# Patient Record
Sex: Female | Born: 1995 | Race: White | Hispanic: No | Marital: Single
Health system: Southern US, Community
[De-identification: ages and names within clinical notes are randomized; demographics above are authoritative.]

---
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
## 2013-01-06 ENCOUNTER — Ambulatory Visit: Payer: Self-pay | Admitting: Otolaryngology

## 2014-05-01 ENCOUNTER — Emergency Department: Payer: Self-pay | Admitting: Emergency Medicine

## 2014-05-01 LAB — COMPREHENSIVE METABOLIC PANEL
ALK PHOS: 54 U/L
ALT: 25 U/L
ANION GAP: 13 (ref 7–16)
Albumin: 4.9 g/dL
BUN: 17 mg/dL
Bilirubin,Total: 0.8 mg/dL
CO2: 23 mmol/L
CREATININE: 0.98 mg/dL
Calcium, Total: 9.5 mg/dL
Chloride: 105 mmol/L
EGFR (African American): >60
EGFR (Non-African Amer.): >60
GLUCOSE: 123 mg/dL — AB
Potassium: 4 mmol/L
SGOT(AST): 27 U/L
Sodium: 141 mmol/L
TOTAL PROTEIN: 8.6 g/dL — AB

## 2014-05-01 LAB — URINALYSIS, COMPLETE
Bilirubin,UR: NEGATIVE
Blood: NEGATIVE
Glucose,UR: NEGATIVE mg/dL (ref 0–75)
Hyaline Cast: 3
Leukocyte Esterase: NEGATIVE
Nitrite: NEGATIVE
Ph: 5 (ref 4.5–8.0)
RBC,UR: 4 /HPF (ref 0–5)
SPECIFIC GRAVITY: 1.035 (ref 1.003–1.030)
Squamous Epithelial: 9

## 2014-05-01 LAB — CBC WITH DIFFERENTIAL/PLATELET
BASOS ABS: 0 10*3/uL (ref 0.0–0.1)
Basophil %: 0.1 %
Eosinophil #: 0 10*3/uL (ref 0.0–0.7)
Eosinophil %: 0.1 %
HCT: 43 % (ref 35.0–47.0)
HGB: 14.1 g/dL (ref 12.0–16.0)
LYMPHS ABS: 0.4 10*3/uL — AB (ref 1.0–3.6)
Lymphocyte %: 2.1 %
MCH: 27 pg (ref 26.0–34.0)
MCHC: 32.7 g/dL (ref 32.0–36.0)
MCV: 83 fL (ref 80–100)
Monocyte #: 0.8 x10 3/mm (ref 0.2–0.9)
Monocyte %: 4.2 %
NEUTROS ABS: 19 10*3/uL — AB (ref 1.4–6.5)
Neutrophil %: 93.5 %
PLATELETS: 307 10*3/uL (ref 150–440)
RBC: 5.2 10*6/uL (ref 3.80–5.20)
RDW: 13.3 % (ref 11.5–14.5)
WBC: 20.3 10*3/uL — ABNORMAL HIGH (ref 3.6–11.0)

## 2014-05-01 LAB — LIPASE, BLOOD: LIPASE: 49 U/L

## 2014-10-31 ENCOUNTER — Telehealth: Payer: Self-pay | Admitting: *Deleted

## 2014-10-31 NOTE — Telephone Encounter (Signed)
Amy Blevins called requesting an appointment with Dr. Marina Goodell. 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 Marina Goodell. Please call her [phone].

## 2016-03-23 IMAGING — CT CT ABD-PELV W/ CM
2 of 4 series · 16 of 46 positions shown, 18 images · IV contrast (omnipaque)
Comparison: None.

CLINICAL DATA: Mid lower back pain, nausea, vomiting, diarrhea and
bloody stool for 6 hours.

EXAM:
CT ABDOMEN AND PELVIS WITH CONTRAST
TECHNIQUE: Multidetector CT imaging of the abdomen and pelvis was performed
using the standard protocol following bolus administration of
intravenous contrast.
CONTRAST:  100 mL Omnipaque 300 IV

[Series 2: routine abd pel with · axial · 0.75mm/px · z∈[-978,-522]mm · 13 of 101 slices shown, 15 images]
[im 5/101  soft-tissue]
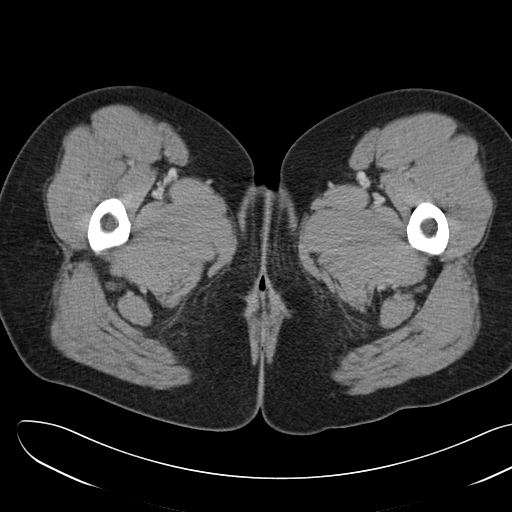
[im 5/101  bone]
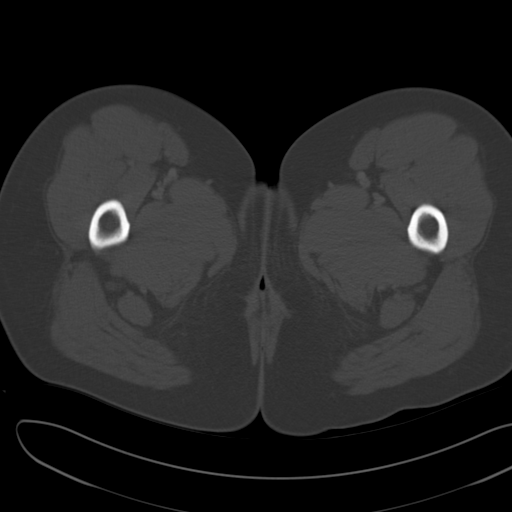
[im 13/101  soft-tissue]
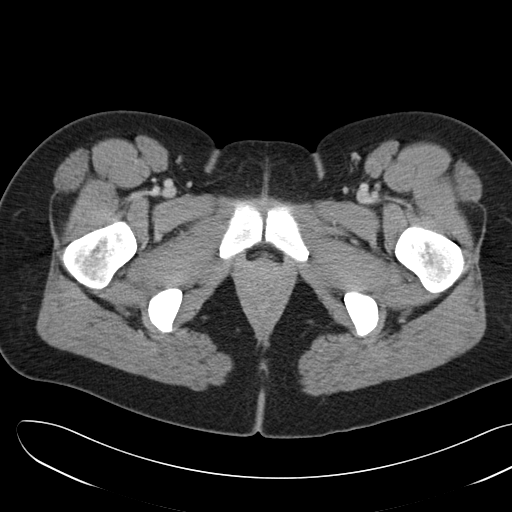
[im 21/101  soft-tissue]
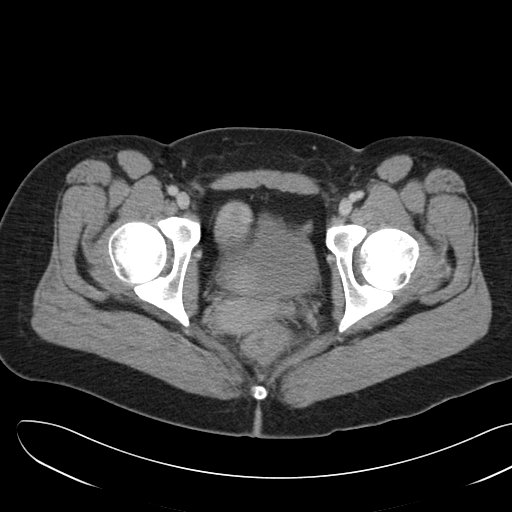
[im 30/101  soft-tissue]
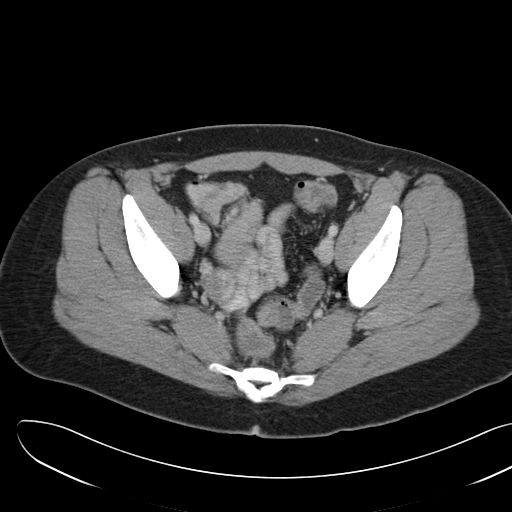
[im 34/101  soft-tissue]
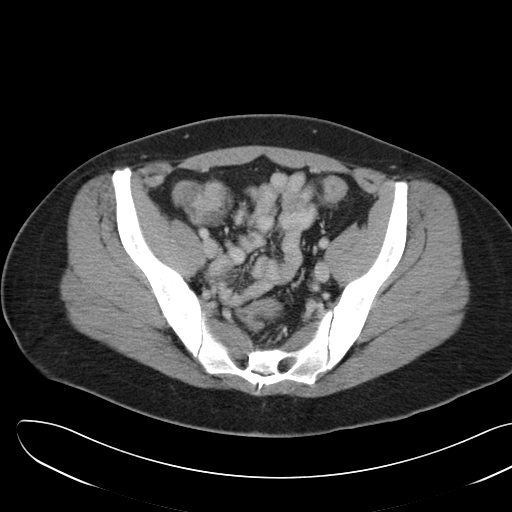
[im 42/101  soft-tissue]
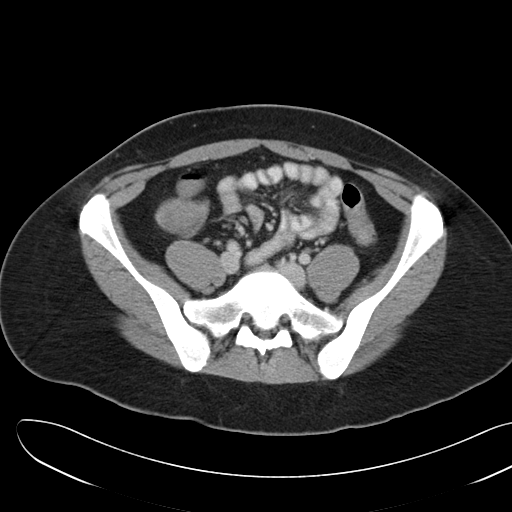
[im 51/101  soft-tissue]
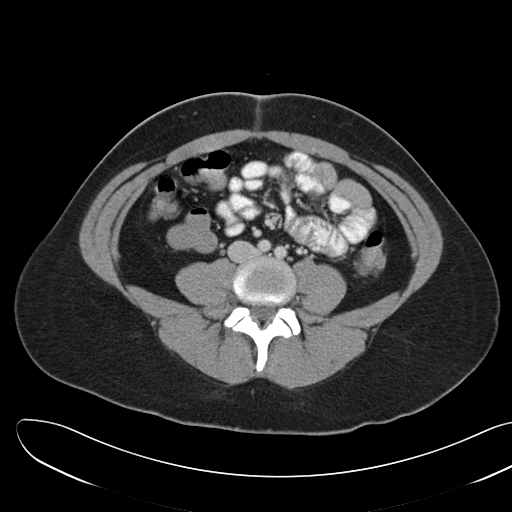
[im 59/101  soft-tissue]
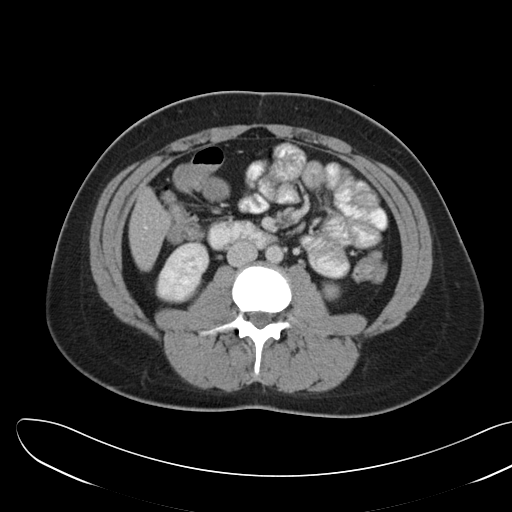
[im 67/101  soft-tissue]
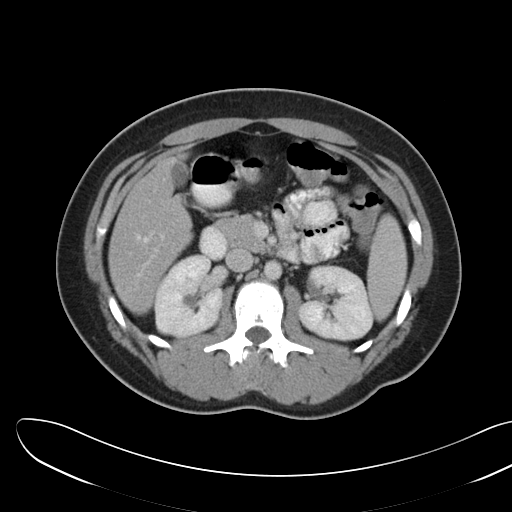
[im 67/101  bone]
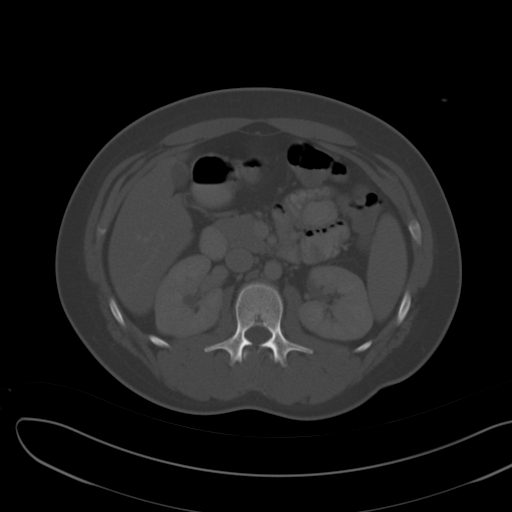
[im 71/101  soft-tissue]
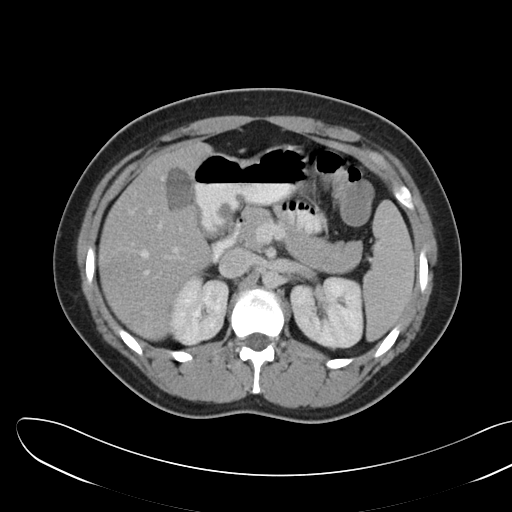
[im 80/101  soft-tissue]
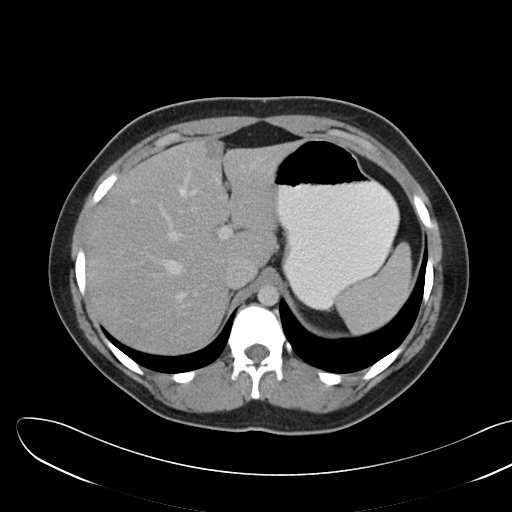
[im 88/101  soft-tissue]
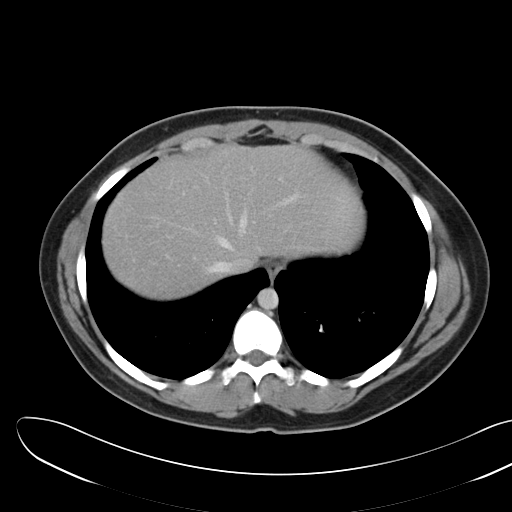
[im 96/101  soft-tissue]
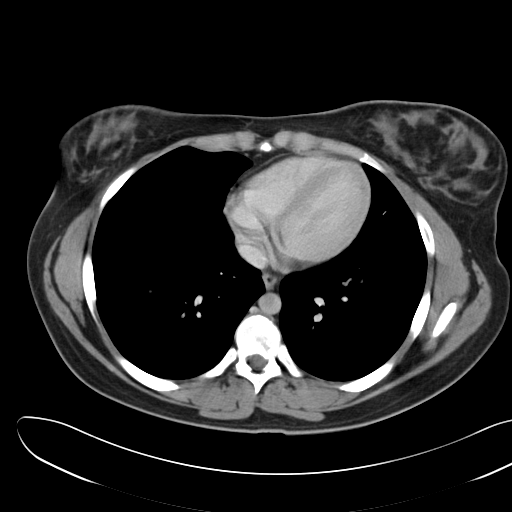

[Series 5: cor routine abd pel with · coronal · 0.75mm/px · 3 of 120 slices shown]
[im 40/120  soft-tissue]
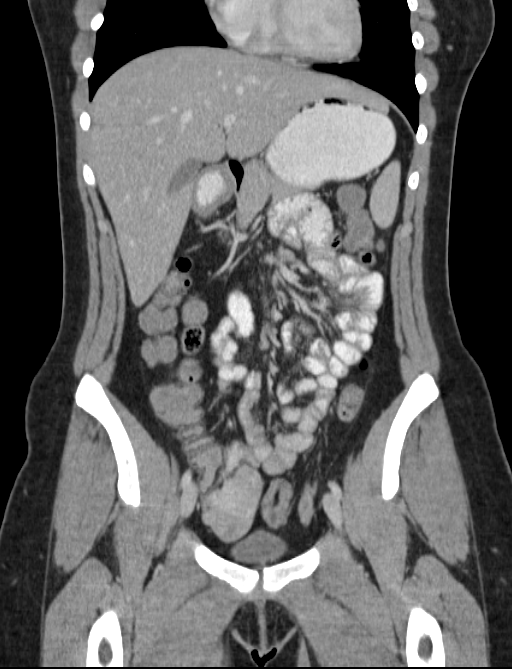
[im 53/120  soft-tissue]
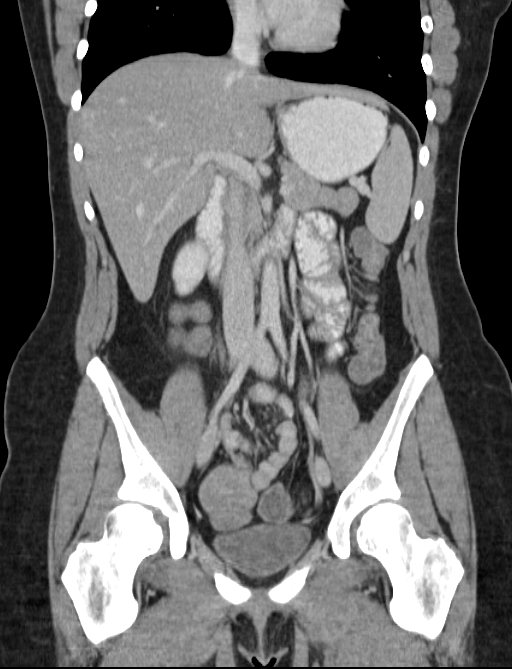
[im 67/120  soft-tissue]
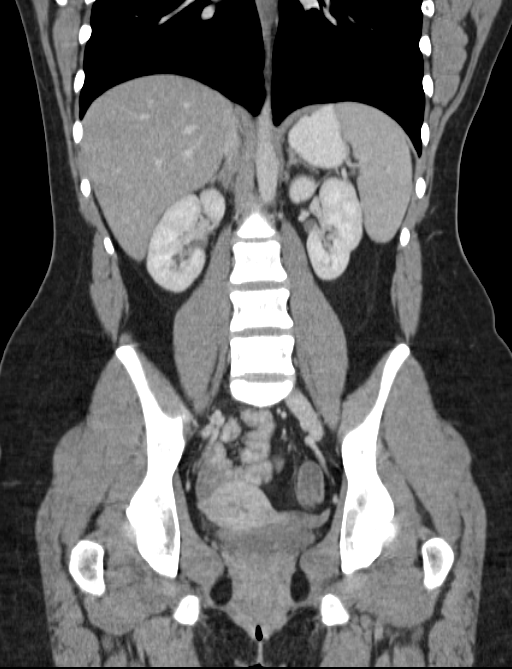

[16 of 46 positions shown; findings below may reference images not displayed]

FINDINGS: The included lung bases are clear.

The liver, gallbladder, spleen, pancreas, and adrenal glands are
normal. Kidneys are symmetric in size with symmetric enhancement. No
hydronephrosis or perinephric stranding.

The stomach is physiologically distended with contrast. There are no
dilated or thickened bowel loops. Patulous appearance of the cecum.
There is fluid distending normal caliber colon. Mild associated
colonic wall thickening is seen involving the descending and
portions of the sigmoid colon. No free air, free fluid, or
intra-abdominal fluid collection.

The appendix is normal.

Abdominal aorta is normal in caliber . No retroperitoneal
adenopathy.

Within the pelvis the urinary bladder is physiologically distended.
The uterus and ovaries are normal for age. No significant pelvic
free fluid.

There are no acute or suspicious osseous abnormalities. Sacroiliac
joints are normal.
IMPRESSION: 1. Fluid-filled colon with scattered wall thickening involving the
descending portions of the sigmoid colon, suggesting colitis. There
is patulous appearance of the cecum.
2. Normal appendix.
